# Patient Record
Sex: Male | Born: 1965 | Race: White | Hispanic: No | Marital: Single | Smoking: Never smoker
Health system: Southern US, Community
[De-identification: ages and names within clinical notes are randomized; demographics above are authoritative.]

## PROBLEM LIST (undated history)

## (undated) DIAGNOSIS — I10 Essential (primary) hypertension: Secondary | ICD-10-CM

## (undated) DIAGNOSIS — K219 Gastro-esophageal reflux disease without esophagitis: Secondary | ICD-10-CM

## (undated) HISTORY — PX: KNEE SURGERY: SHX244

---

## 2017-08-17 ENCOUNTER — Other Ambulatory Visit: Payer: Self-pay

## 2017-08-17 ENCOUNTER — Encounter (HOSPITAL_BASED_OUTPATIENT_CLINIC_OR_DEPARTMENT_OTHER): Payer: Self-pay | Admitting: Emergency Medicine

## 2017-08-17 ENCOUNTER — Emergency Department (HOSPITAL_BASED_OUTPATIENT_CLINIC_OR_DEPARTMENT_OTHER): Payer: No Typology Code available for payment source

## 2017-08-17 ENCOUNTER — Emergency Department (HOSPITAL_BASED_OUTPATIENT_CLINIC_OR_DEPARTMENT_OTHER)
Admission: EM | Admit: 2017-08-17 | Discharge: 2017-08-17 | Disposition: A | Discharge status: Home / Self Care | Payer: No Typology Code available for payment source | Attending: Emergency Medicine | Admitting: Emergency Medicine

## 2017-08-17 DIAGNOSIS — I10 Essential (primary) hypertension: Secondary | ICD-10-CM | POA: Diagnosis not present

## 2017-08-17 DIAGNOSIS — N281 Cyst of kidney, acquired: Secondary | ICD-10-CM | POA: Diagnosis not present

## 2017-08-17 DIAGNOSIS — R079 Chest pain, unspecified: Secondary | ICD-10-CM | POA: Diagnosis present

## 2017-08-17 LAB — COMPREHENSIVE METABOLIC PANEL
ALK PHOS: 59 U/L (ref 38–126)
ALT: 42 U/L (ref 17–63)
AST: 28 U/L (ref 15–41)
Albumin: 4.2 g/dL (ref 3.5–5.0)
Anion gap: 7 (ref 5–15)
BUN: 12 mg/dL (ref 6–20)
CALCIUM: 9.1 mg/dL (ref 8.9–10.3)
CO2: 25 mmol/L (ref 22–32)
CREATININE: 0.97 mg/dL (ref 0.61–1.24)
Chloride: 104 mmol/L (ref 101–111)
Glucose, Bld: 115 mg/dL — ABNORMAL HIGH (ref 65–99)
Potassium: 3.9 mmol/L (ref 3.5–5.1)
Sodium: 136 mmol/L (ref 135–145)
Total Bilirubin: 0.4 mg/dL (ref 0.3–1.2)
Total Protein: 8.2 g/dL — ABNORMAL HIGH (ref 6.5–8.1)

## 2017-08-17 LAB — CBC WITH DIFFERENTIAL/PLATELET
Basophils Absolute: 0 10*3/uL (ref 0.0–0.1)
Basophils Relative: 0 %
Eosinophils Absolute: 0.1 10*3/uL (ref 0.0–0.7)
Eosinophils Relative: 1 %
HCT: 45.6 % (ref 39.0–52.0)
HEMOGLOBIN: 16.7 g/dL (ref 13.0–17.0)
LYMPHS PCT: 18 %
Lymphs Abs: 1.3 10*3/uL (ref 0.7–4.0)
MCH: 32.6 pg (ref 26.0–34.0)
MCHC: 36.6 g/dL — ABNORMAL HIGH (ref 30.0–36.0)
MCV: 88.9 fL (ref 78.0–100.0)
Monocytes Absolute: 0.6 10*3/uL (ref 0.1–1.0)
Monocytes Relative: 9 %
NEUTROS ABS: 5.1 10*3/uL (ref 1.7–7.7)
NEUTROS PCT: 72 %
Platelets: 225 10*3/uL (ref 150–400)
RBC: 5.13 MIL/uL (ref 4.22–5.81)
RDW: 13.2 % (ref 11.5–15.5)
WBC: 7.1 10*3/uL (ref 4.0–10.5)

## 2017-08-17 LAB — URINALYSIS, ROUTINE W REFLEX MICROSCOPIC
BILIRUBIN URINE: NEGATIVE
Glucose, UA: NEGATIVE mg/dL
HGB URINE DIPSTICK: NEGATIVE
KETONES UR: NEGATIVE mg/dL
Leukocytes, UA: NEGATIVE
NITRITE: NEGATIVE
Protein, ur: NEGATIVE mg/dL
Specific Gravity, Urine: <1.005 — ABNORMAL LOW (ref 1.005–1.030)
pH: 6 (ref 5.0–8.0)

## 2017-08-17 LAB — TROPONIN I: Troponin I: <0.03 ng/mL (ref <0.03)

## 2017-08-17 LAB — LIPASE, BLOOD: Lipase: 48 U/L (ref 11–51)

## 2017-08-17 MED ORDER — LISINOPRIL 10 MG PO TABS: 10.0000 mg | ORAL_TABLET | Freq: Every day | ORAL | 0 refills | Status: AC

## 2017-08-17 MED ORDER — IOPAMIDOL (ISOVUE-300) INJECTION 61%
100.0000 mL | Freq: Once | INTRAVENOUS | Status: AC | PRN
  Administered 2017-08-17: 100 mL via INTRAVENOUS

## 2017-08-17 MED ORDER — HYDROCODONE-ACETAMINOPHEN 5-325 MG PO TABS: 1.0000 | ORAL_TABLET | Freq: Four times a day (QID) | ORAL | 0 refills | Status: AC | PRN

## 2017-08-17 MED ORDER — LISINOPRIL 10 MG PO TABS
10.0000 mg | ORAL_TABLET | Freq: Once | ORAL | Status: AC
  Administered 2017-08-17: 10 mg via ORAL
  Filled 2017-08-17: qty 1

## 2017-08-17 MED ORDER — ONDANSETRON HCL 4 MG/2ML IJ SOLN
4.0000 mg | Freq: Once | INTRAMUSCULAR | Status: AC
Start: 2017-08-17 — End: 2017-08-17
  Administered 2017-08-17: 4 mg via INTRAVENOUS
  Filled 2017-08-17: qty 2

## 2017-08-17 MED ORDER — ESOMEPRAZOLE MAGNESIUM 40 MG PO CPDR: 40.0000 mg | DELAYED_RELEASE_CAPSULE | Freq: Every day | ORAL | 0 refills | Status: AC

## 2017-08-17 MED ORDER — SODIUM CHLORIDE 0.9 % IV BOLUS (SEPSIS)
1000.0000 mL | Freq: Once | INTRAVENOUS | Status: AC
  Administered 2017-08-17: 1000 mL via INTRAVENOUS

## 2017-08-17 MED ORDER — MORPHINE SULFATE (PF) 4 MG/ML IV SOLN
4.0000 mg | Freq: Once | INTRAVENOUS | Status: AC
  Administered 2017-08-17: 4 mg via INTRAVENOUS
  Filled 2017-08-17: qty 1

## 2017-08-17 NOTE — ED Notes (Signed)
Patient returned from ultrasound.

## 2017-08-17 NOTE — ED Notes (Signed)
Ambulatory to bathroom without asssitance

## 2017-08-17 NOTE — ED Triage Notes (Signed)
Reports constant mid chest pain which began midday yesterday and worsened over night.  States that occasionally he does feel a "squeezing" in his chest.  Denies nausea, vomiting.

## 2017-08-17 NOTE — Discharge Instructions (Signed)
Take tylenol for pain.   Take nexium daily for possible gastritis.   Take vicodin for severe pain. DO NOT drive with it.   Take lisinopril daily to control your blood pressure. Recheck blood pressure with your doctor next week   See urologist regarding your renal cyst   Return to ER if you have worse abdominal pain, chest pain, fever, vomiting

## 2017-08-17 NOTE — ED Provider Notes (Signed)
[facility] EMERGENCY DEPARTMENT Provider Note   CSN: [id number] Arrival date & time: 08/17/17  [id number]     History   Chief Complaint Chief Complaint  Patient presents with  . Chest Pain    HPI Hunter Gordon is a 52 y.o. male otherwise healthy here presenting with abdominal pain, back pain.  Patient states that yesterday he went to work and he had some epigastric pain and nausea sensation.  States that last night the epigastric pain got worse and radiated to his flank area.  It also radiated to his lower chest.  Denies any shortness of breath or numbness or weakness.  Patient denies any alcohol use and states that he could not get comfortable.  He took some Pepto-Bismol last night with no relief.  Patient states that he thought he may have some gallstones but was never diagnosed with gallstones or kidney stones.  Urinary symptoms and denies blood in his urine. Denies any hx of cardiac stents. No medical problems, not on any meds currently.   The history is provided by the patient.    History reviewed. No pertinent past medical history.  There are no active problems to display for this patient.   History reviewed. No pertinent surgical history.     Home Medications    Prior to Admission medications   Not on File    Family History History reviewed. No pertinent family history.  Social History Social History   Tobacco Use  . Smoking status: Never Smoker  . Smokeless tobacco: Never Used  Substance Use Topics  . Alcohol use: No    Frequency: Never  . Drug use: No     Allergies   Patient has no known allergies.   Review of Systems Review of Systems  Cardiovascular: Positive for chest pain.  Gastrointestinal: Positive for abdominal pain and nausea.  All other systems reviewed and are negative.    Physical Exam Updated Vital Signs BP (!) 165/103   Pulse 82   Temp (!) 97.5 F (36.4 C) (Oral)   Resp 18   Ht 5\' 9"  (1.753 m)   Wt 96.2 kg (212 lb)    SpO2 97%   BMI 31.31 kg/m   Physical Exam  Constitutional:  Uncomfortable   HENT:  Head: Normocephalic.  Eyes: Pupils are equal, round, and reactive to light.  Neck: Normal range of motion.  Cardiovascular: Normal rate, regular rhythm and normal pulses.  Pulmonary/Chest: Effort normal and breath sounds normal.  Abdominal:  + epigastric tenderness, mild RUQ tenderness, neg murphy's. No obvious CVAT   Musculoskeletal: Normal range of motion.       Right lower leg: Normal.       Left lower leg: Normal.  Neurological: He is alert.  Skin: Skin is warm.  Psychiatric: He has a normal mood and affect.  Nursing note and vitals reviewed.    ED Treatments / Results  Labs (all labs ordered are listed, but only abnormal results are displayed) Labs Reviewed  CBC WITH DIFFERENTIAL/PLATELET - Abnormal; Notable for the following components:      Result Value   MCHC 36.6 (*)    All other components within normal limits  COMPREHENSIVE METABOLIC PANEL - Abnormal; Notable for the following components:   Glucose, Bld 115 (*)    Total Protein 8.2 (*)    All other components within normal limits  URINALYSIS, ROUTINE W REFLEX MICROSCOPIC - Abnormal; Notable for the following components:   Specific Gravity, Urine <1.005 (*)  All other components within normal limits  TROPONIN I  LIPASE, BLOOD  TROPONIN I    EKG  EKG Interpretation  Date/Time:  Tuesday August 17 2017 07:38:34 EST Ventricular Rate:  84 PR Interval:    QRS Duration: 115 QT Interval:  394 QTC Calculation: 466 R Axis:   3 Text Interpretation:  Sinus rhythm Probable left ventricular hypertrophy Anterior ST elevation, probably due to LVH Baseline wander in lead(s) V1 V2 No significant change since last tracing Confirmed by Richardean Canal [phone]) on 08/17/2017 8:03:26 AM       Radiology Dg Chest 2 View  Result Date: 08/17/2017 CLINICAL DATA:  Chest pain and nausea EXAM: CHEST  2 VIEW COMPARISON:  None. FINDINGS: Lungs  are clear. Heart size and pulmonary vascularity are normal. No adenopathy. No pneumothorax. No bone lesions. IMPRESSION: No edema or consolidation. Electronically Signed   By: Bretta Bang III M.D.   On: 08/17/2017 08:00   Ct Chest W Contrast  Result Date: 08/17/2017 CLINICAL DATA:  Chest pain. Abdominal pain. Unintended weight loss. Complex left renal cyst. EXAM: CT CHEST AND ABDOMEN WITH CONTRAST TECHNIQUE: Multidetector CT imaging of the chest and abdomen was performed following the standard protocol during bolus administration of intravenous contrast. CONTRAST:  ISOVUE-300 IOPAMIDOL (ISOVUE-300) INJECTION 61% COMPARISON:  Ultrasound dated 08/17/2017 FINDINGS: CT CHEST FINDINGS Cardiovascular: No significant vascular findings. Normal heart size. No pericardial effusion. Mediastinum/Nodes: No enlarged mediastinal, hilar, or axillary lymph nodes. Thyroid gland, trachea, and esophagus demonstrate no significant findings. Lungs/Pleura: Tiny area of linear atelectasis in the right lower lobe. The lungs are otherwise clear. No effusions. Musculoskeletal: No chest wall mass or suspicious bone lesions identified. CT ABDOMEN FINDINGS Hepatobiliary: Small focal area of fatty infiltration in the left lobe adjacent to the falciform ligament. Liver parenchyma is otherwise normal. Biliary tree is normal. Pancreas: Unremarkable. No pancreatic ductal dilatation or surrounding inflammatory changes. Spleen: Normal in size without focal abnormality. Adrenals/Urinary Tract: Slightly lobulated 5.5 cm cyst on the anterior aspect of the lower pole of the left kidney. The appearance is consistent with a benign renal cyst. The kidneys otherwise appear normal. Ureters and bladder are normal. Adrenal glands are normal. Stomach/Bowel: Stomach is within normal limits. Appendix appears normal. No acute inflammatory changes. Prominent submucosal fat in the terminal ileum with no evidence of active inflammation.  Vascular/Lymphatic: No significant vascular findings are present. No enlarged abdominal or pelvic lymph nodes. Other: No abdominal wall hernia or abnormality. No abdominopelvic ascites. Musculoskeletal: No acute or significant osseous findings. IMPRESSION: 1. Negative CT scan of the chest. 2. Benign-appearing abdomen and pelvis. Simple appearing cyst on the lower pole of the left kidney. Electronically Signed   By: Francene Boyers M.D.   On: 08/17/2017 11:00   US Abdomen Complete  Result Date: 08/17/2017 CLINICAL DATA:  Epigastric pain.  Chest pain.  Weakness. EXAM: ABDOMEN ULTRASOUND COMPLETE COMPARISON:  No recent. FINDINGS: Gallbladder: No gallstones or wall thickening visualized. No sonographic Murphy sign noted by sonographer. Common bile duct: Diameter: 5.0 mm Liver: Increased echogenicity consistent with fatty infiltration and/or hepatocellular disease. Portal vein is patent on color Doppler imaging with normal direction of blood flow towards the liver. IVC: No abnormality visualized. Pancreas: Visualized portion unremarkable. Spleen: Size and appearance within normal limits. Right Kidney: Length: 11.7 cm. Echogenicity within normal limits. No mass or hydronephrosis visualized. Left Kidney: Length: 11.8 cm. Echogenicity within normal limits. 5.8 cm left renal complex cyst with thickened walls and septation. A mural nodule may be  present. No hydronephrosis visualized. Abdominal aorta: No aneurysm visualized. Other findings: None. IMPRESSION: 1. 5.8 cm left renal complex cyst with thickened walls and septation. A mural nodule may be present. A malignant cystic renal lesion could present this fashion and further evaluation with gadolinium-enhanced MRI suggested. 2. Increased echogenicity of the liver consistent fatty infiltration and/or hepatocellular disease. No gallstones or biliary distention. Electronically Signed   By: Maisie Fus  Register   On: 08/17/2017 09:09   Ct Abdomen Pelvis W Contrast  Result  Date: 08/17/2017 CLINICAL DATA:  Chest pain. Abdominal pain. Unintended weight loss. Complex left renal cyst. EXAM: CT CHEST AND ABDOMEN WITH CONTRAST TECHNIQUE: Multidetector CT imaging of the chest and abdomen was performed following the standard protocol during bolus administration of intravenous contrast. CONTRAST:  ISOVUE-300 IOPAMIDOL (ISOVUE-300) INJECTION 61% COMPARISON:  Ultrasound dated 08/17/2017 FINDINGS: CT CHEST FINDINGS Cardiovascular: No significant vascular findings. Normal heart size. No pericardial effusion. Mediastinum/Nodes: No enlarged mediastinal, hilar, or axillary lymph nodes. Thyroid gland, trachea, and esophagus demonstrate no significant findings. Lungs/Pleura: Tiny area of linear atelectasis in the right lower lobe. The lungs are otherwise clear. No effusions. Musculoskeletal: No chest wall mass or suspicious bone lesions identified. CT ABDOMEN FINDINGS Hepatobiliary: Small focal area of fatty infiltration in the left lobe adjacent to the falciform ligament. Liver parenchyma is otherwise normal. Biliary tree is normal. Pancreas: Unremarkable. No pancreatic ductal dilatation or surrounding inflammatory changes. Spleen: Normal in size without focal abnormality. Adrenals/Urinary Tract: Slightly lobulated 5.5 cm cyst on the anterior aspect of the lower pole of the left kidney. The appearance is consistent with a benign renal cyst. The kidneys otherwise appear normal. Ureters and bladder are normal. Adrenal glands are normal. Stomach/Bowel: Stomach is within normal limits. Appendix appears normal. No acute inflammatory changes. Prominent submucosal fat in the terminal ileum with no evidence of active inflammation. Vascular/Lymphatic: No significant vascular findings are present. No enlarged abdominal or pelvic lymph nodes. Other: No abdominal wall hernia or abnormality. No abdominopelvic ascites. Musculoskeletal: No acute or significant osseous findings. IMPRESSION: 1. Negative CT scan  of the chest. 2. Benign-appearing abdomen and pelvis. Simple appearing cyst on the lower pole of the left kidney. Electronically Signed   By: Francene Boyers M.D.   On: 08/17/2017 11:00    Procedures Procedures (including critical care time)  Medications Ordered in ED Medications  morphine 4 MG/ML injection 4 mg (4 mg Intravenous Given 08/17/17 0812)  ondansetron (ZOFRAN) injection 4 mg (4 mg Intravenous Given 08/17/17 0812)  sodium chloride 0.9 % bolus 1,000 mL (0 mLs Intravenous Stopped 08/17/17 0856)  iopamidol (ISOVUE-300) 61 % injection 100 mL (100 mLs Intravenous Contrast Given 08/17/17 1034)  lisinopril (PRINIVIL,ZESTRIL) tablet 10 mg (10 mg Oral Given 08/17/17 1121)     Initial Impression / Assessment and Plan / ED Course  I have reviewed the triage vital signs and the nursing notes.  Pertinent labs & imaging results that were available during my care of the patient were reviewed by me and considered in my medical decision making (see chart for details).     Hunter Gordon is a 52 y.o. male here with epigastric pain, RUQ pain. Hypertensive as well but no hx of hypertension. I think likely pancreatitis vs biliary colic. Low suspicion for dissection so if CXR showed no widened mediastinum, will hold off on CTA. Will get labs, lipase, UA. Will get abdominal US.   12:11 PM Pain controlled. US showed possible L renal mass. CT confirmed simple cyst. UA showed no  UTI. Nl lipase and LFTs. Consider pain from renal cyst vs gastroenteritis. Will dc home with nexium, lisinopril for hypertension,  Urology follow up.   Final Clinical Impressions(s) / ED Diagnoses   Final diagnoses:  None    ED Discharge Orders    None       Charlynne Pander, MD 08/17/17 1214

## 2017-12-03 ENCOUNTER — Encounter (HOSPITAL_BASED_OUTPATIENT_CLINIC_OR_DEPARTMENT_OTHER): Payer: Self-pay | Admitting: Emergency Medicine

## 2017-12-03 ENCOUNTER — Emergency Department (HOSPITAL_BASED_OUTPATIENT_CLINIC_OR_DEPARTMENT_OTHER): Payer: No Typology Code available for payment source

## 2017-12-03 ENCOUNTER — Other Ambulatory Visit: Payer: Self-pay

## 2017-12-03 ENCOUNTER — Emergency Department (HOSPITAL_BASED_OUTPATIENT_CLINIC_OR_DEPARTMENT_OTHER)
Admission: EM | Admit: 2017-12-03 | Discharge: 2017-12-03 | Disposition: A | Discharge status: Home / Self Care | Payer: No Typology Code available for payment source | Attending: Emergency Medicine | Admitting: Emergency Medicine

## 2017-12-03 DIAGNOSIS — I10 Essential (primary) hypertension: Secondary | ICD-10-CM | POA: Insufficient documentation

## 2017-12-03 DIAGNOSIS — R0789 Other chest pain: Secondary | ICD-10-CM | POA: Diagnosis not present

## 2017-12-03 DIAGNOSIS — Z79899 Other long term (current) drug therapy: Secondary | ICD-10-CM | POA: Insufficient documentation

## 2017-12-03 DIAGNOSIS — R079 Chest pain, unspecified: Secondary | ICD-10-CM | POA: Diagnosis present

## 2017-12-03 HISTORY — DX: Gastro-esophageal reflux disease without esophagitis: K21.9

## 2017-12-03 HISTORY — DX: Essential (primary) hypertension: I10

## 2017-12-03 LAB — BASIC METABOLIC PANEL
Anion gap: 7 (ref 5–15)
BUN: 14 mg/dL (ref 6–20)
CALCIUM: 8.8 mg/dL — AB (ref 8.9–10.3)
CO2: 27 mmol/L (ref 22–32)
CREATININE: 1.03 mg/dL (ref 0.61–1.24)
Chloride: 105 mmol/L (ref 101–111)
GFR calc non Af Amer: >60 mL/min (ref >60)
Glucose, Bld: 121 mg/dL — ABNORMAL HIGH (ref 65–99)
Potassium: 4 mmol/L (ref 3.5–5.1)
Sodium: 139 mmol/L (ref 135–145)

## 2017-12-03 LAB — CBC
HCT: 45.2 % (ref 39.0–52.0)
Hemoglobin: 16.2 g/dL (ref 13.0–17.0)
MCH: 32.6 pg (ref 26.0–34.0)
MCHC: 35.8 g/dL (ref 30.0–36.0)
MCV: 90.9 fL (ref 78.0–100.0)
PLATELETS: 216 10*3/uL (ref 150–400)
RBC: 4.97 MIL/uL (ref 4.22–5.81)
RDW: 13.1 % (ref 11.5–15.5)
WBC: 6.8 10*3/uL (ref 4.0–10.5)

## 2017-12-03 LAB — TROPONIN I

## 2017-12-03 NOTE — ED Notes (Signed)
ED Provider at bedside. 

## 2017-12-03 NOTE — ED Provider Notes (Signed)
[facility] EMERGENCY DEPARTMENT Provider Note   CSN: [id number] Arrival date & time: 12/03/17  4098     History   Chief Complaint Chief Complaint  Patient presents with  . Chest Pain    HPI Hunter Gordon is a 52 y.o. male.  HPI  Patient is a 52 year old with past medical history of hypertension, which she has recently begun to control with his PCP.  Was seen here in February and EKG with suggestions of possible LVH.  Since that time, patient has been worried about his heart.  He states that since February he has noticed occasional twinges under his left breast.  These last for several seconds.  He denies feeling of palpitations associated.  He describes an additional feeling of tightness.  He states the frequency of these are increasing over the past couple of weeks, and have been constant x 18 hours.   Of note, diagnosed with hypertension at last ED visit and has been following up with a PCP to treat this.  Currently on losartan he states his average home blood pressures are 130s over 100s.  Feels his headaches have improved since treating his blood pressure.  He does admit that he is quite anxious, has been stressed recently at work and has been working of note, states he gets short of breath when walking up 3 flights of stairs, but this is been present for 9 months and unchanged.  No worsening of chest pressure with ambulation or exertion.  Past Medical History:  Diagnosis Date  . GERD (gastroesophageal reflux disease)   . Hypertension     There are no active problems to display for this patient.   Past Surgical History:  Procedure Laterality Date  . KNEE SURGERY          Home Medications    Prior to Admission medications   Medication Sig Start Date End Date Taking? Authorizing Provider  losartan (COZAAR) 50 MG tablet Take 50 mg by mouth daily.   Yes [provider]  esomeprazole (NEXIUM) 40 MG capsule Take 1 capsule (40 mg total) by mouth daily.  08/17/17   Charlynne Pander, MD  HYDROcodone-acetaminophen (NORCO/VICODIN) 5-325 MG tablet Take 1 tablet by mouth every 6 (six) hours as needed. 08/17/17   Charlynne Pander, MD  lisinopril (PRINIVIL,ZESTRIL) 10 MG tablet Take 1 tablet (10 mg total) by mouth daily. 08/17/17   Charlynne Pander, MD    Family History No family history on file.  Social History Social History   Tobacco Use  . Smoking status: Never Smoker  . Smokeless tobacco: Never Used  Substance Use Topics  . Alcohol use: Not Currently    Frequency: Never  . Drug use: No     Allergies   Patient has no known allergies.   Review of Systems Review of Systems  Constitutional: Negative for activity change, appetite change and diaphoresis.  Respiratory: Positive for chest tightness and shortness of breath.   Cardiovascular: Positive for chest pain. Negative for palpitations and leg swelling.  Gastrointestinal: Negative for abdominal distention and abdominal pain.     Physical Exam Updated Vital Signs BP (!) 132/99 (BP Location: Right Arm)   Pulse 86   Temp 98.3 F (36.8 C) (Oral)   Resp 18   Ht 5\' 9"  (1.753 m)   Wt 96.2 kg (212 lb)   SpO2 97%   BMI 31.31 kg/m   Physical Exam  Constitutional: He is oriented to person, place, and time. He appears well-developed  and well-nourished. He does not appear ill. No distress.  HENT:  Head: Normocephalic and atraumatic.  Eyes: EOM are normal.  Neck: Normal range of motion.  Cardiovascular: Normal rate and regular rhythm.  No murmur heard. Pulmonary/Chest: Effort normal and breath sounds normal.  Abdominal: Soft. Bowel sounds are normal.  Neurological: He is alert and oriented to person, place, and time.  Skin: Capillary refill takes less than 2 seconds.  Psychiatric: His behavior is normal. His mood appears anxious.     ED Treatments / Results  Labs (all labs ordered are listed, but only abnormal results are displayed) Labs Reviewed  BASIC METABOLIC  PANEL - Abnormal; Notable for the following components:      Result Value   Glucose, Bld 121 (*)    Calcium 8.8 (*)    All other components within normal limits  CBC  TROPONIN I    EKG EKG Interpretation  Date/Time:  Friday December 03 2017 08:31:23 EDT Ventricular Rate:  89 PR Interval:    QRS Duration: 119 QT Interval:  390 QTC Calculation: 475 R Axis:   -34 Text Interpretation:  Sinus rhythm Consider left atrial enlargement Nonspecific intraventricular conduction delay Borderline ST elevation, anterior leads Baseline wander in lead(s) III aVF V1 No significant change since last tracing Confirmed by Gwyneth SproutPlunkett, Whitney ([id number]) on 12/03/2017 8:38:05 AM   Radiology Dg Chest 2 View  Result Date: 12/03/2017 CLINICAL DATA:  Chest pain EXAM: CHEST - 2 VIEW COMPARISON:  08/17/2017 FINDINGS: Heart and mediastinal contours are within normal limits. No focal opacities or effusions. No acute bony abnormality. IMPRESSION: No active cardiopulmonary disease. Electronically Signed   By: Charlett NoseKevin  Dover M.D.   On: 12/03/2017 09:05    Procedures Procedures (including critical care time)  Medications Ordered in ED Medications - No data to display   Initial Impression / Assessment and Plan / ED Course  I have reviewed the triage vital signs and the nursing notes.  Pertinent labs & imaging results that were available during my care of the patient were reviewed by me and considered in my medical decision making (see chart for details).     Heart score of 3 based on age, nonspecific EKG changes although present at last visit as well, and treatment for hypertension.  History is slightly suspicious, troponin negative.  ACS unlikely given these findings.  Additionally considered GERD, however this does not typically last seconds.  Vital signs stable making dissection less likely.  No URI symptoms or cough to suggest lung etiology of chest pain.  Encouraged patient to follow-up with PCP and consider  echocardiogram based on shortness of breath with climbing stairs.  Also encouraged him to consider counseling for his anxiety and stress management.  Final Clinical Impressions(s) / ED Diagnoses   Final diagnoses:  Atypical chest pain    ED Discharge Orders    None     Loni MuseKate Timberlake, MD PGY 2 FM   Garth Bignessimberlake, Kathryn, MD 12/03/17 1106    Gwyneth SproutPlunkett, Whitney, MD 12/03/17 [phone]

## 2017-12-03 NOTE — ED Notes (Signed)
ED Provider at bedside discussing test results and plan of care. 

## 2017-12-03 NOTE — Discharge Instructions (Addendum)
Your tests suggest that it is very unlikely anything serious is going on with your heart.  Please discuss with your regular doctor consideration of an echocardiogram, heart ultrasound, and consider speaking to a counselor about your stress.  Return if your chest pain worsens or changes or you are otherwise concerned.

## 2017-12-03 NOTE — ED Notes (Signed)
Pt on cardiac monitor and auto VS 

## 2017-12-03 NOTE — ED Triage Notes (Signed)
Left sided CP since last night. Has been intmittent for a couple weeks but mostly constant since last night.

## 2018-08-08 IMAGING — US US ABDOMEN COMPLETE
1 series · 13 of 25 positions shown · non-contrast
Comparison: No recent.

CLINICAL DATA: Epigastric pain.  Chest pain.  Weakness.

EXAM:
ABDOMEN ULTRASOUND COMPLETE

[Series 1: us abdomen complete · 0.27mm/px · 13 of 116 slices shown]
[im 1/116]
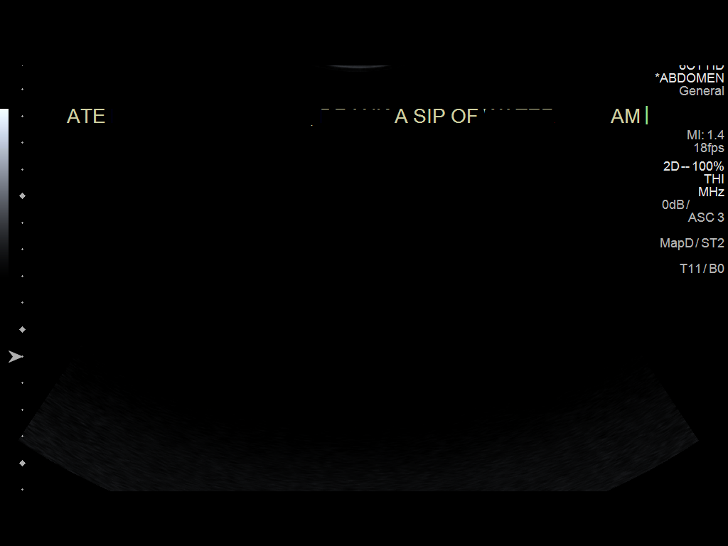
[im 10/116]
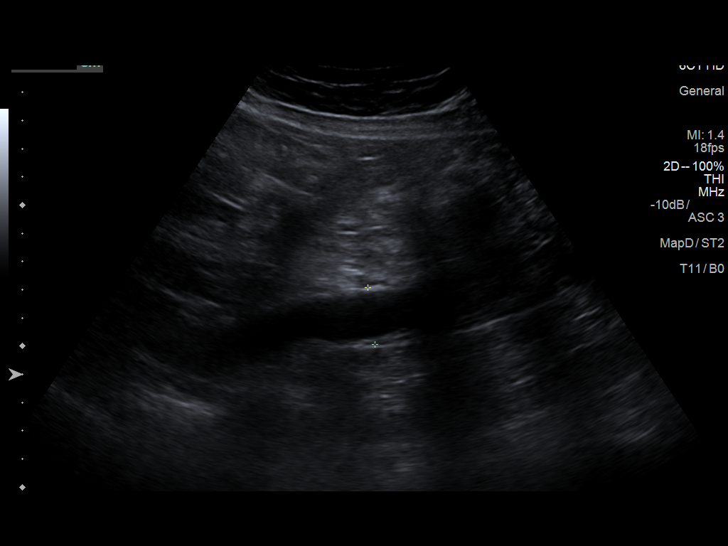
[im 20/116]
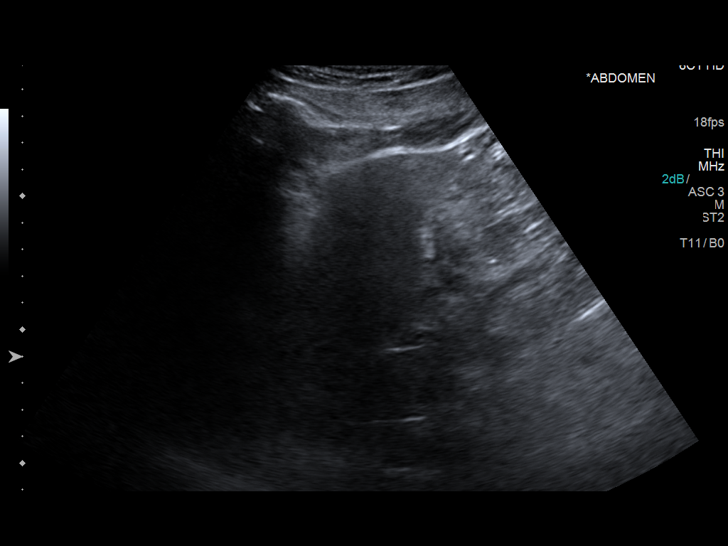
[im 29/116]
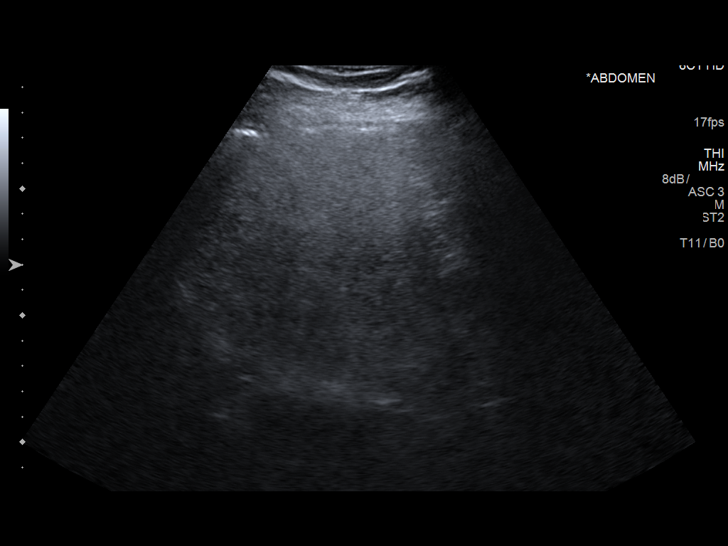
[im 39/116]
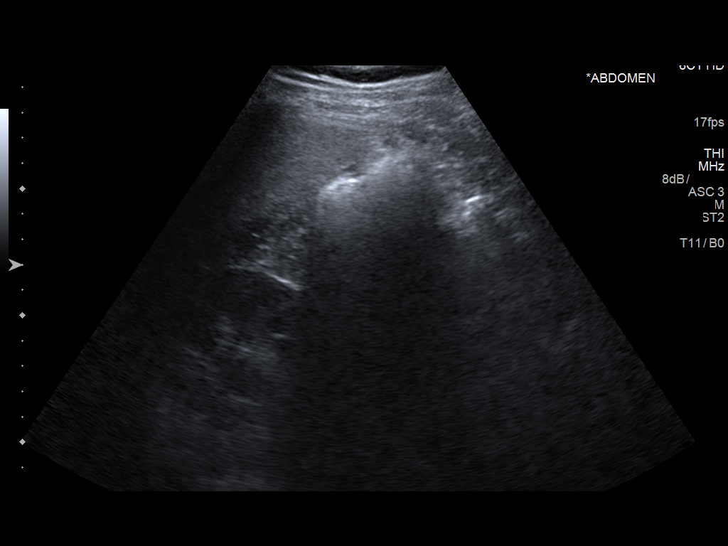
[im 48/116]
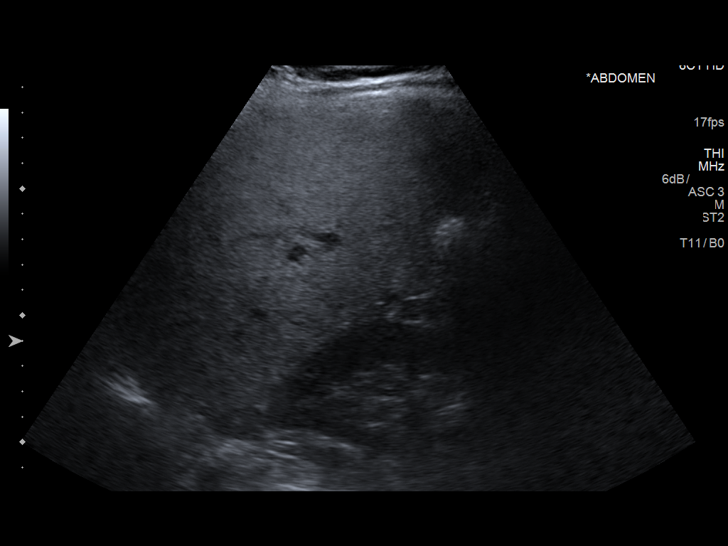
[im 58/116]
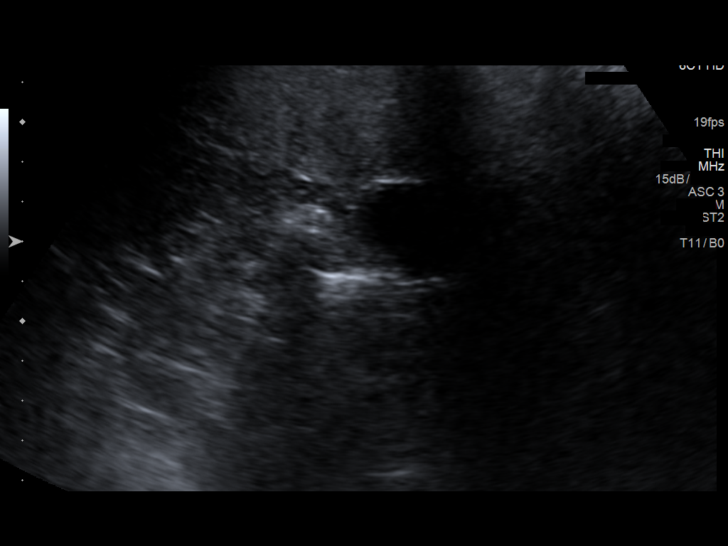
[im 68/116]
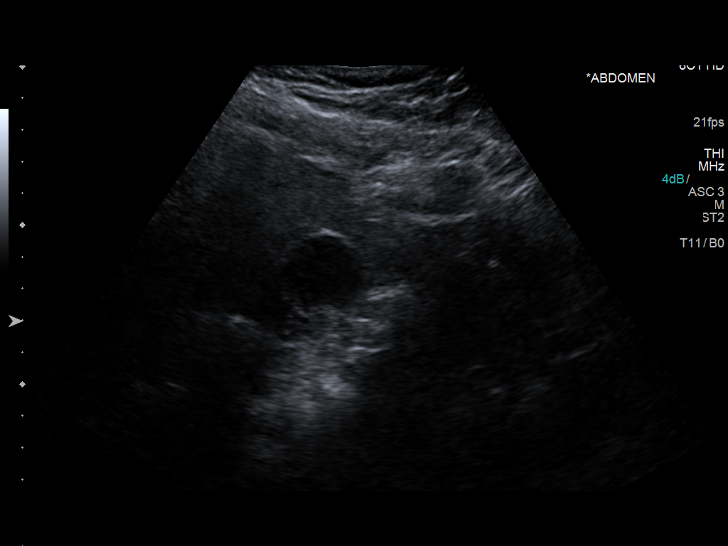
[im 77/116]
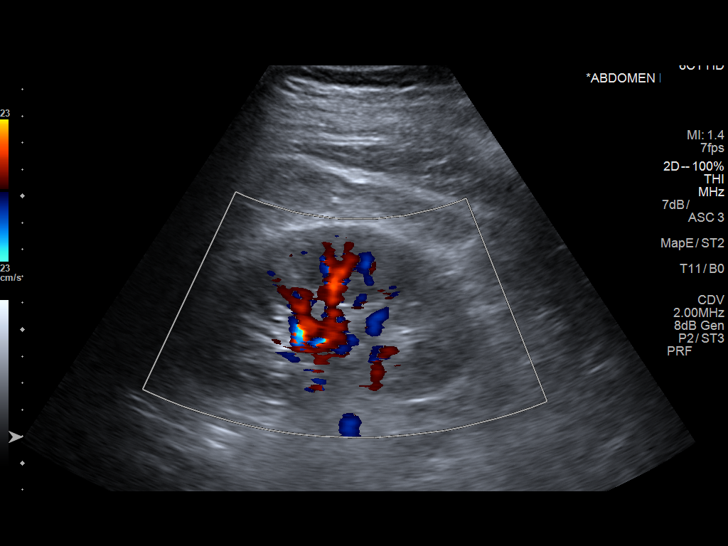
[im 87/116]
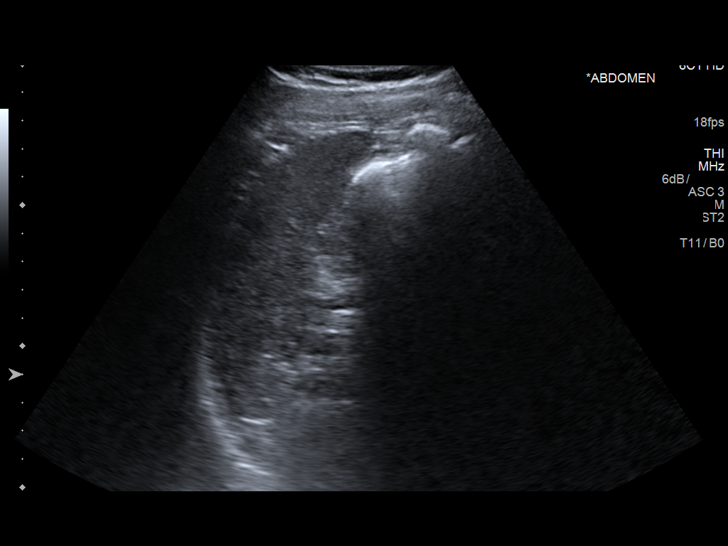
[im 96/116]
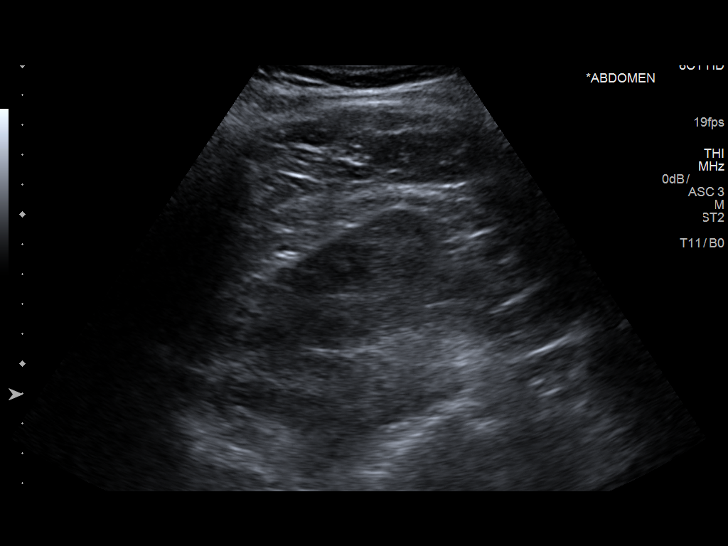
[im 106/116]
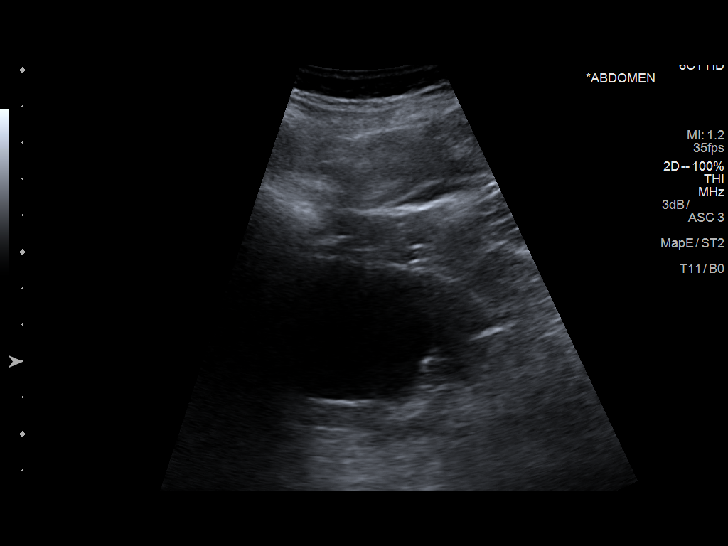
[im 116/116]
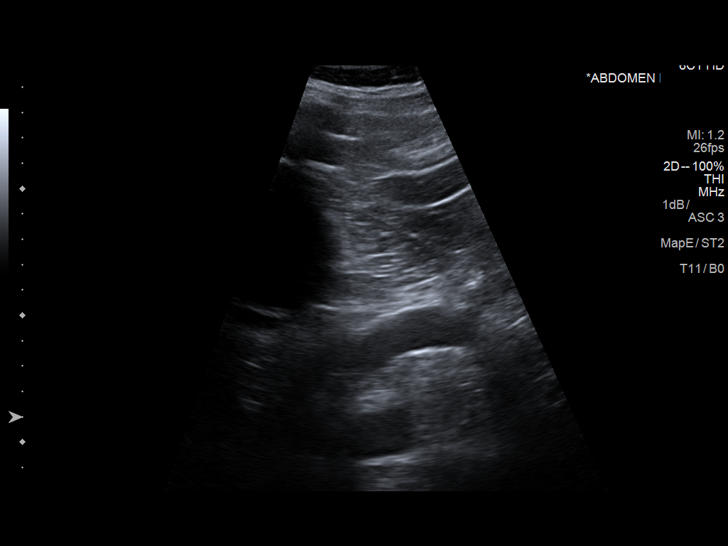

[13 of 25 positions shown; findings below may reference images not displayed]

FINDINGS: Gallbladder: No gallstones or wall thickening visualized. No
sonographic Murphy sign noted by sonographer.

Common bile duct: Diameter: 5.0 mm

Liver: Increased echogenicity consistent with fatty infiltration
and/or hepatocellular disease. Portal vein is patent on color
Doppler imaging with normal direction of blood flow towards the
liver.

IVC: No abnormality visualized.

Pancreas: Visualized portion unremarkable.

Spleen: Size and appearance within normal limits.

Right Kidney: Length: 11.7 cm. Echogenicity within normal limits. No
mass or hydronephrosis visualized.

Left Kidney: Length: 11.8 cm. Echogenicity within normal limits.
cm left renal complex cyst with thickened walls and septation. A
mural nodule may be present. No hydronephrosis visualized.

Abdominal aorta: No aneurysm visualized.

Other findings: None.
IMPRESSION: 1. 5.8 cm left renal complex cyst with thickened walls and
septation. A mural nodule may be present. A malignant cystic renal
lesion could present this fashion and further evaluation with
gadolinium-enhanced MRI suggested.

2. Increased echogenicity of the liver consistent fatty infiltration
and/or hepatocellular disease. No gallstones or biliary distention.

## 2019-05-10 IMAGING — DX DG CHEST 2V
2 series · 2 of 2 positions shown · non-contrast
Comparison: None.

CLINICAL DATA: Chest pain and nausea

EXAM:
CHEST  2 VIEW

[chest pa]
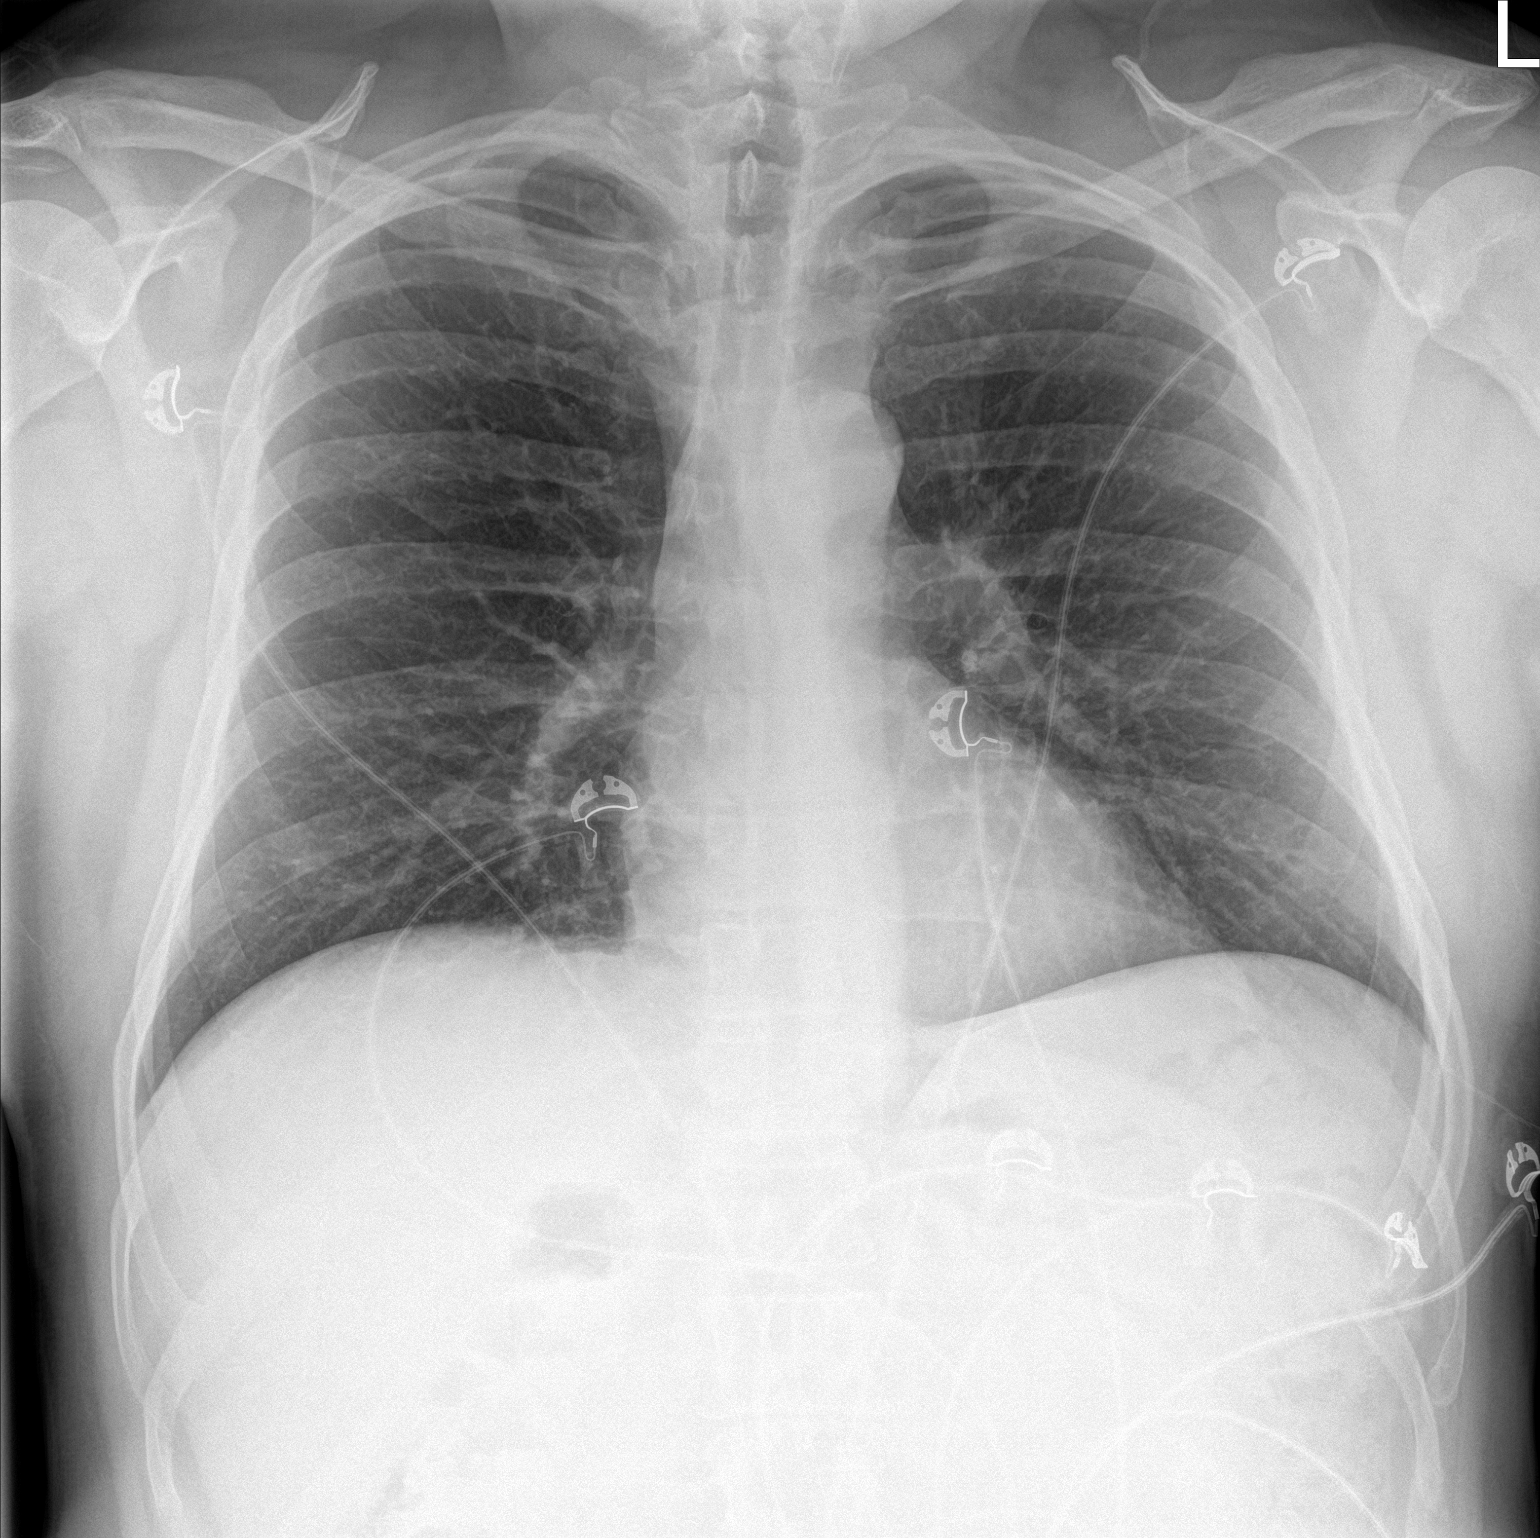

[chest lat]
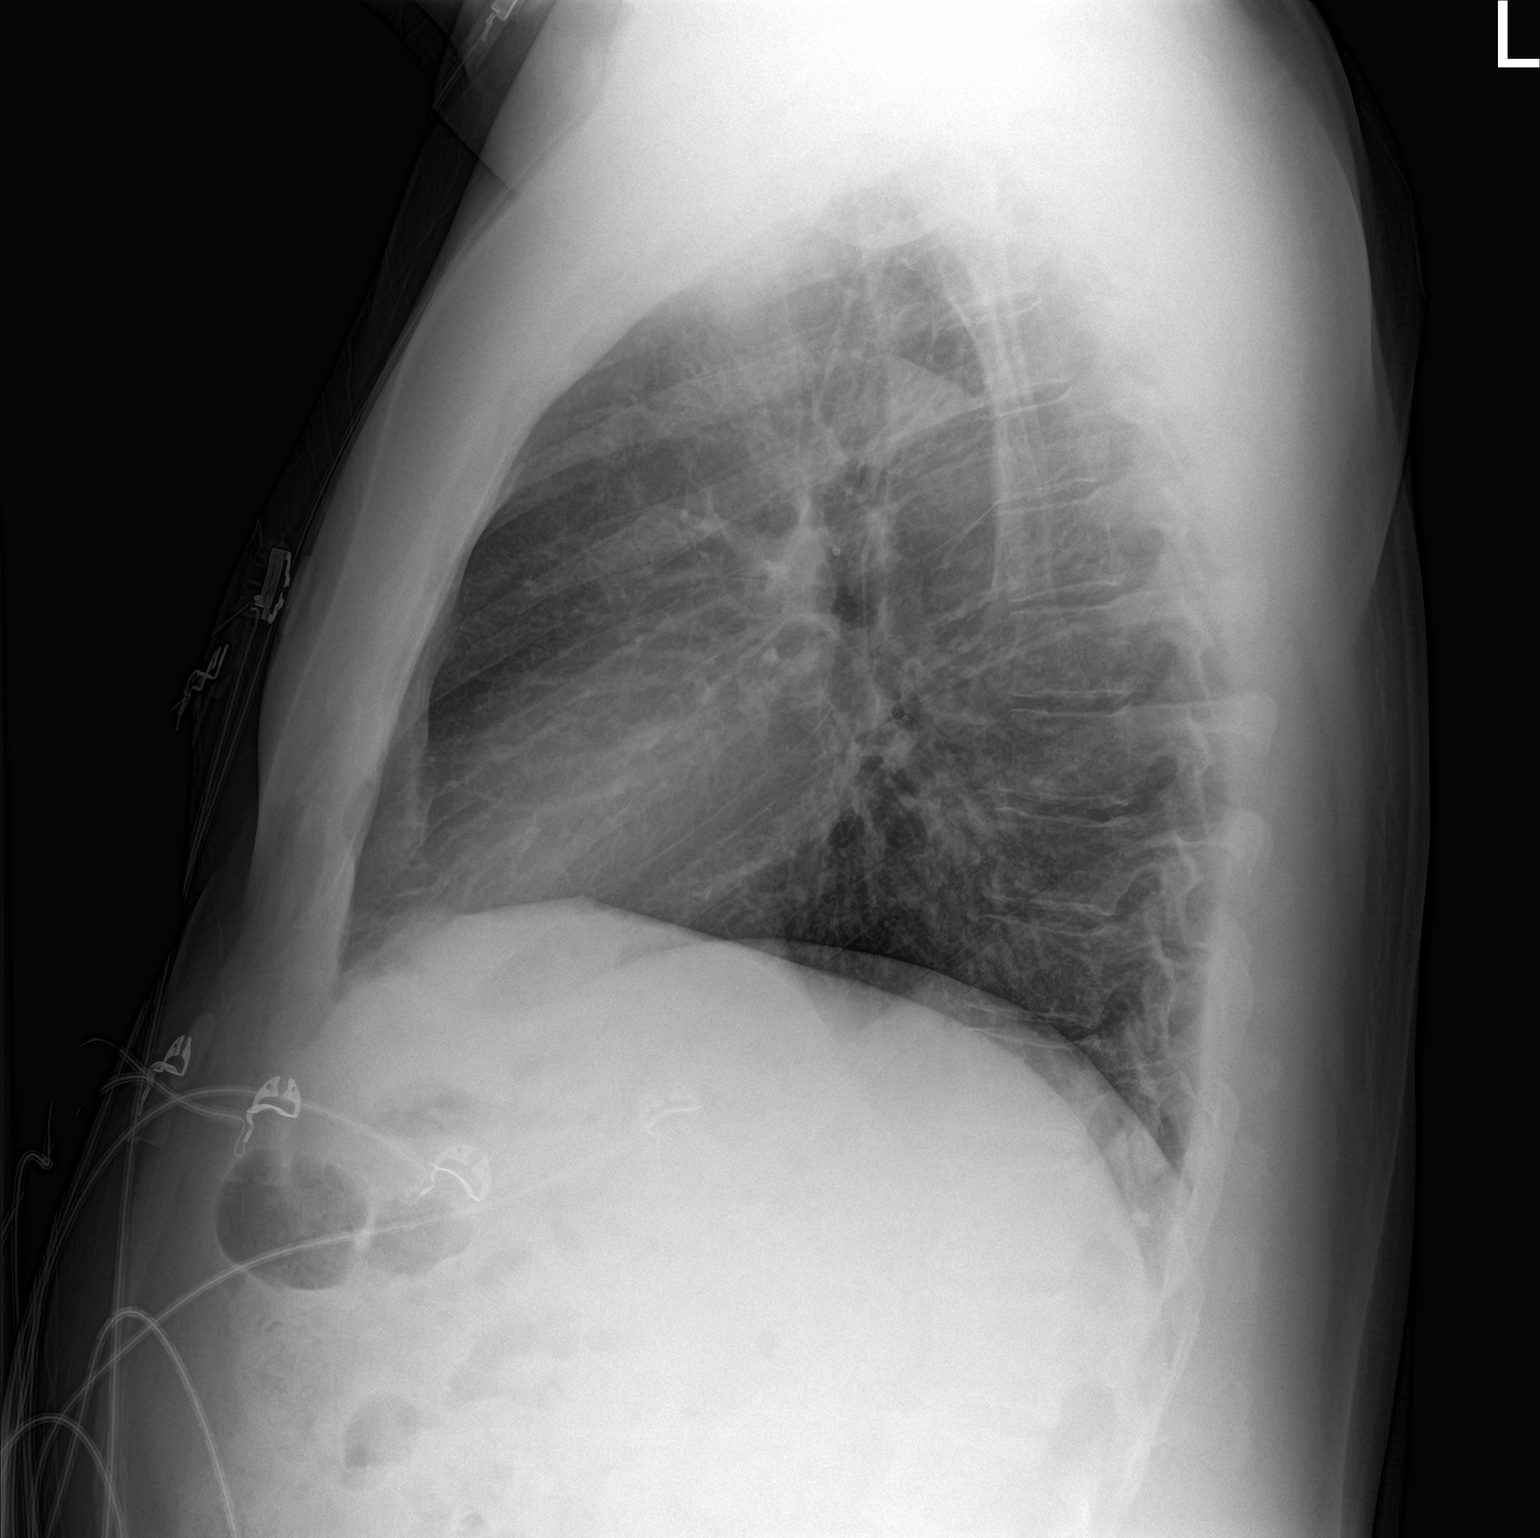

[2 of 2 positions shown; findings below may reference images not displayed]

FINDINGS: Lungs are clear. Heart size and pulmonary vascularity are normal. No
adenopathy. No pneumothorax. No bone lesions.
IMPRESSION: No edema or consolidation.

## 2019-05-10 IMAGING — CT CT ABD-PELV W/ CM
2 of 5 series · 14 of 36 positions shown, 17 images · IV contrast (iopamidol)
Comparison: Ultrasound dated 08/17/2017

CLINICAL DATA: Chest pain. Abdominal pain. Unintended weight loss.
Complex left renal cyst.

EXAM:
CT CHEST AND ABDOMEN WITH CONTRAST
TECHNIQUE: Multidetector CT imaging of the chest and abdomen was performed
following the standard protocol during bolus administration of
intravenous contrast.
CONTRAST:  100mL [id number] IOPAMIDOL ([id number]) INJECTION 61%

[Series 2: cap with 2 · axial · 0.94mm/px · z∈[-708,-68]mm · 11 of 154 slices shown, 14 images]
[im 13/154  mediastinal]
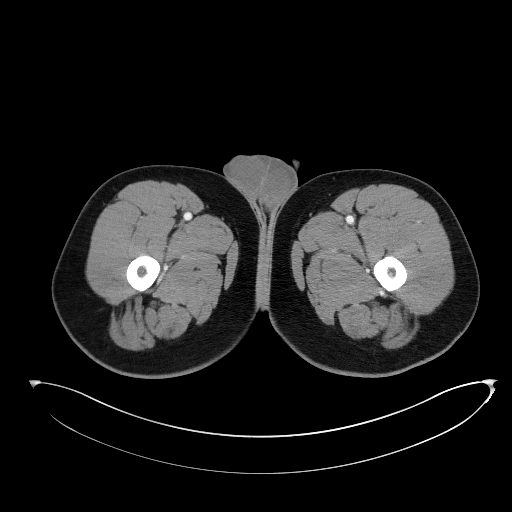
[im 13/154  lung]
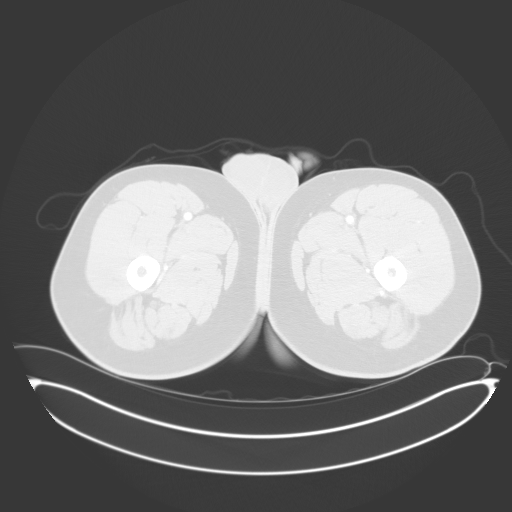
[im 26/154  lung]
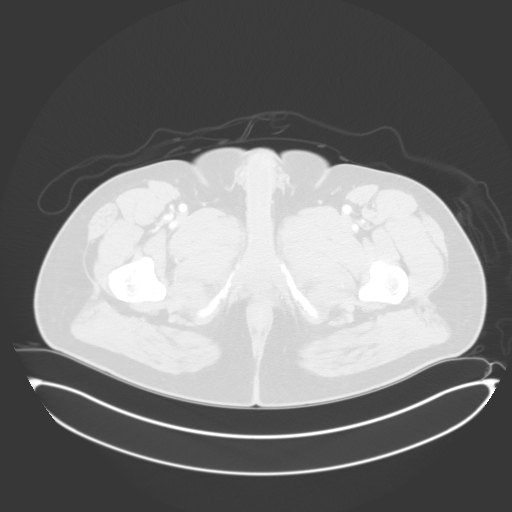
[im 39/154  lung]
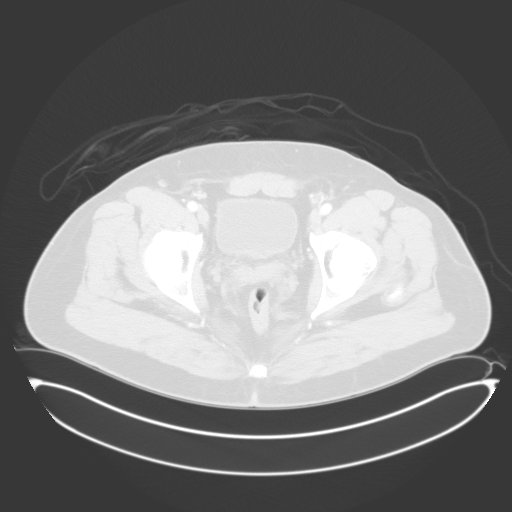
[im 52/154  lung]
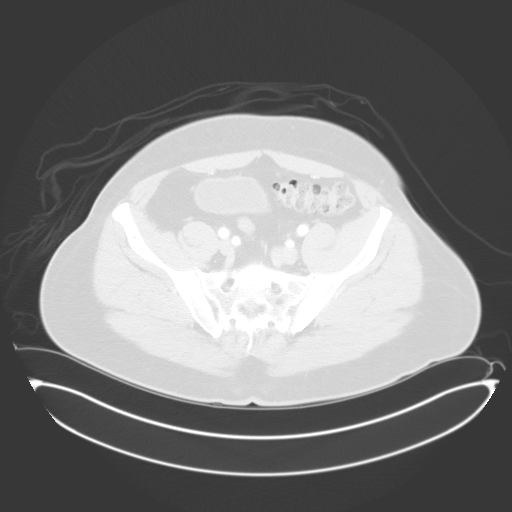
[im 64/154  mediastinal]
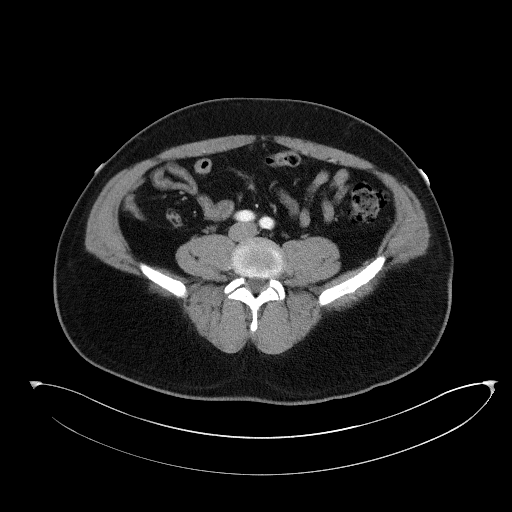
[im 64/154  lung]
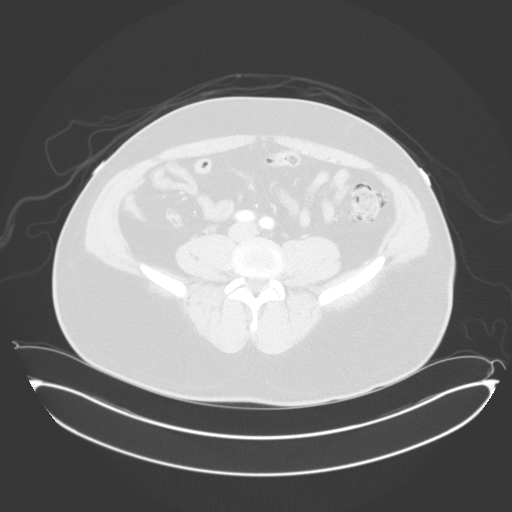
[im 77/154  lung]
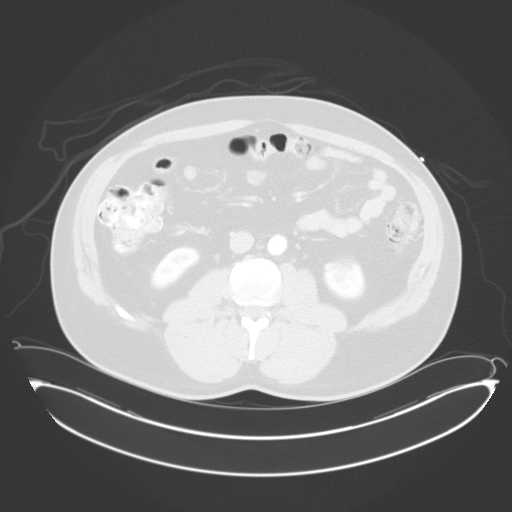
[im 90/154  lung]
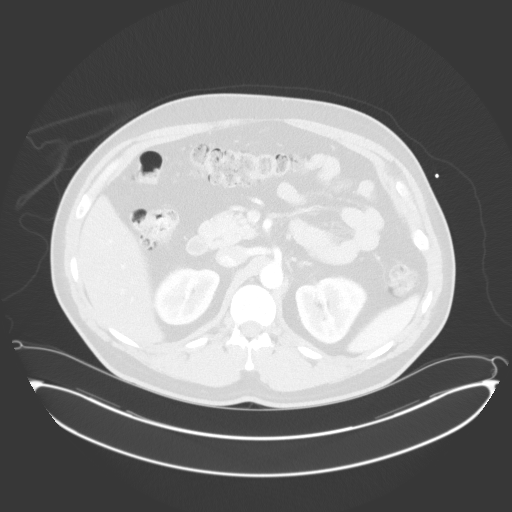
[im 103/154  lung]
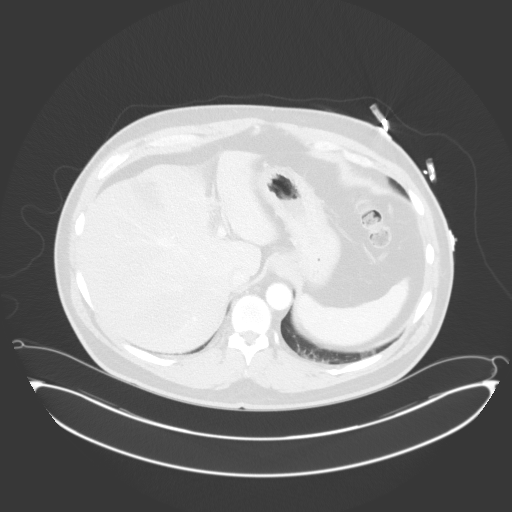
[im 115/154  mediastinal]
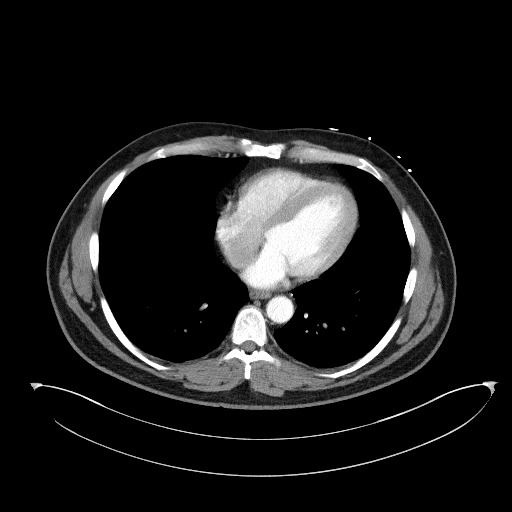
[im 115/154  lung]
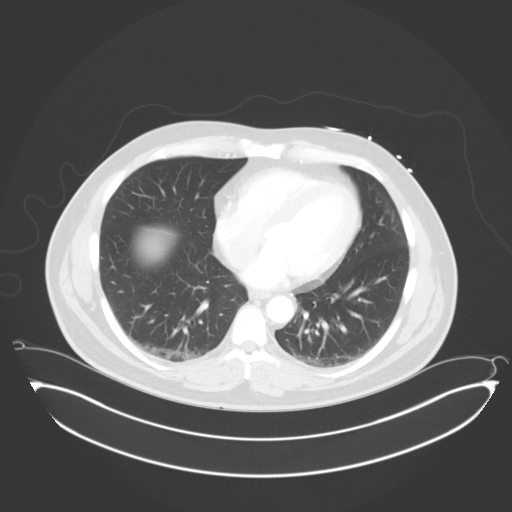
[im 128/154  lung]
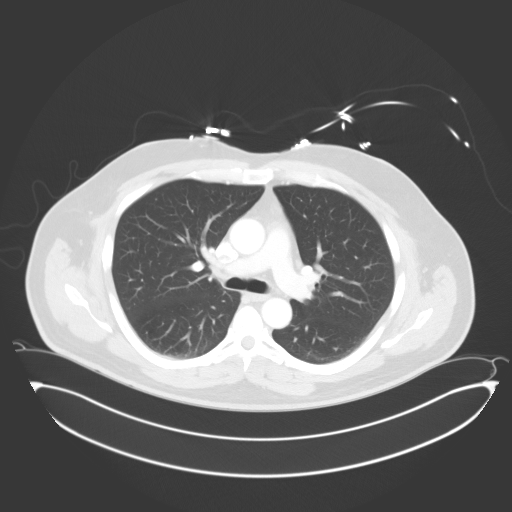
[im 141/154  lung]
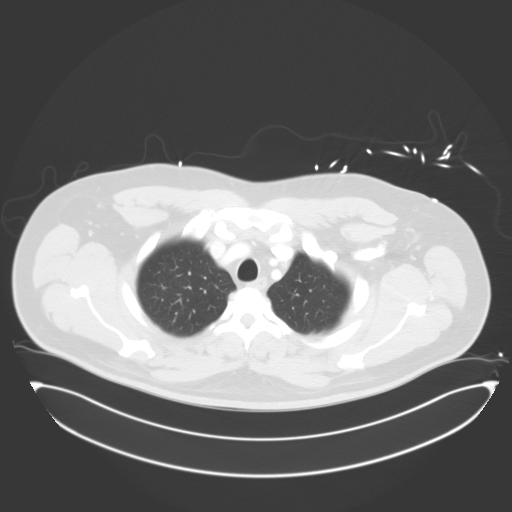

[Series 4: coronals · coronal · 0.95mm/px · 3 of 154 slices shown]
[im 31/154  lung]
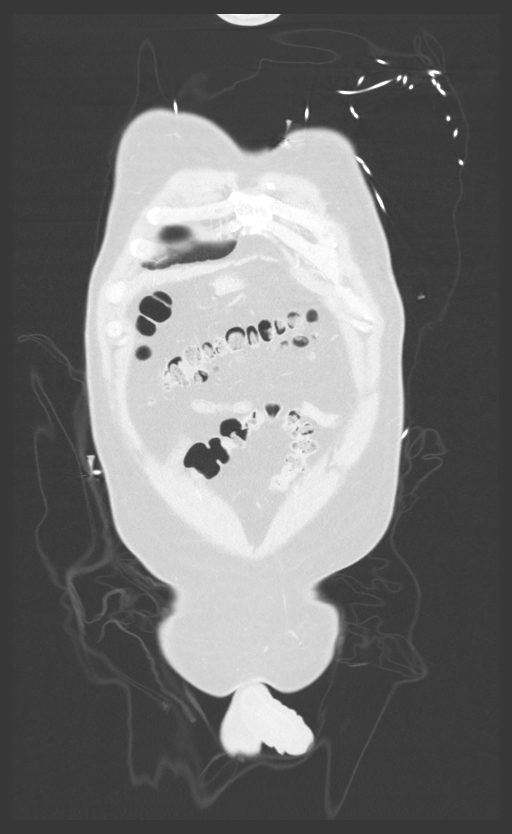
[im 62/154  lung]
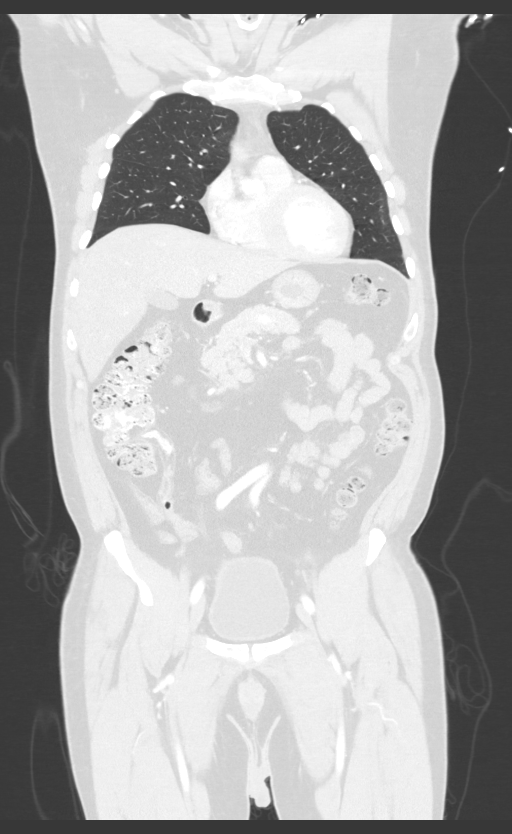
[im 92/154  lung]
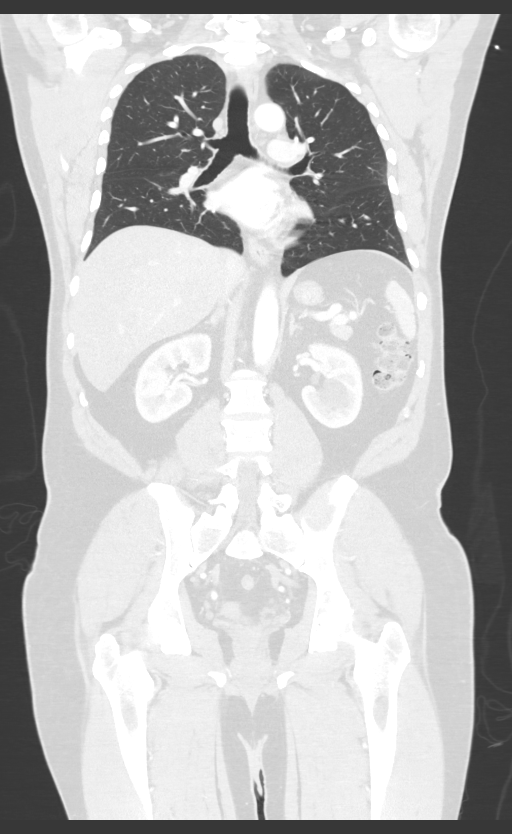

[14 of 36 positions shown; findings below may reference images not displayed]

FINDINGS: CT CHEST FINDINGS

Cardiovascular: No significant vascular findings. Normal heart size.
No pericardial effusion.

Mediastinum/Nodes: No enlarged mediastinal, hilar, or axillary lymph
nodes. Thyroid gland, trachea, and esophagus demonstrate no
significant findings.

Lungs/Pleura: Tiny area of linear atelectasis in the right lower
lobe. The lungs are otherwise clear. No effusions.

Musculoskeletal: No chest wall mass or suspicious bone lesions
identified.

CT ABDOMEN FINDINGS

Hepatobiliary: Small focal area of fatty infiltration in the left
lobe adjacent to the falciform ligament. Liver parenchyma is
otherwise normal. Biliary tree is normal.

Pancreas: Unremarkable. No pancreatic ductal dilatation or
surrounding inflammatory changes.

Spleen: Normal in size without focal abnormality.

Adrenals/Urinary Tract: Slightly lobulated 5.5 cm cyst on the
anterior aspect of the lower pole of the left kidney. The appearance
is consistent with a benign renal cyst. The kidneys otherwise appear
normal. Ureters and bladder are normal. Adrenal glands are normal.

Stomach/Bowel: Stomach is within normal limits. Appendix appears
normal. No acute inflammatory changes. Prominent submucosal fat in
the terminal ileum with no evidence of active inflammation.

Vascular/Lymphatic: No significant vascular findings are present. No
enlarged abdominal or pelvic lymph nodes.

Other: No abdominal wall hernia or abnormality. No abdominopelvic
ascites.

Musculoskeletal: No acute or significant osseous findings.
IMPRESSION: 1. Negative CT scan of the chest.
2. Benign-appearing abdomen and pelvis. Simple appearing cyst on the
lower pole of the left kidney.
